# Patient Record
Sex: Female | Born: 1995 | Race: Black or African American | Hispanic: No | Marital: Single | Smoking: Never smoker
Health system: Southern US, Community
[De-identification: ages and names within clinical notes are randomized; demographics above are authoritative.]

## PROBLEM LIST (undated history)

## (undated) HISTORY — PX: TONSILLECTOMY: SUR1361

---
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
## 2016-02-16 IMAGING — CR DG CHEST 2V
2 series · 2 of 2 positions shown · non-contrast
Comparison: None.

CLINICAL DATA: Cough

EXAM:
CHEST  2 VIEW

[chest pa]
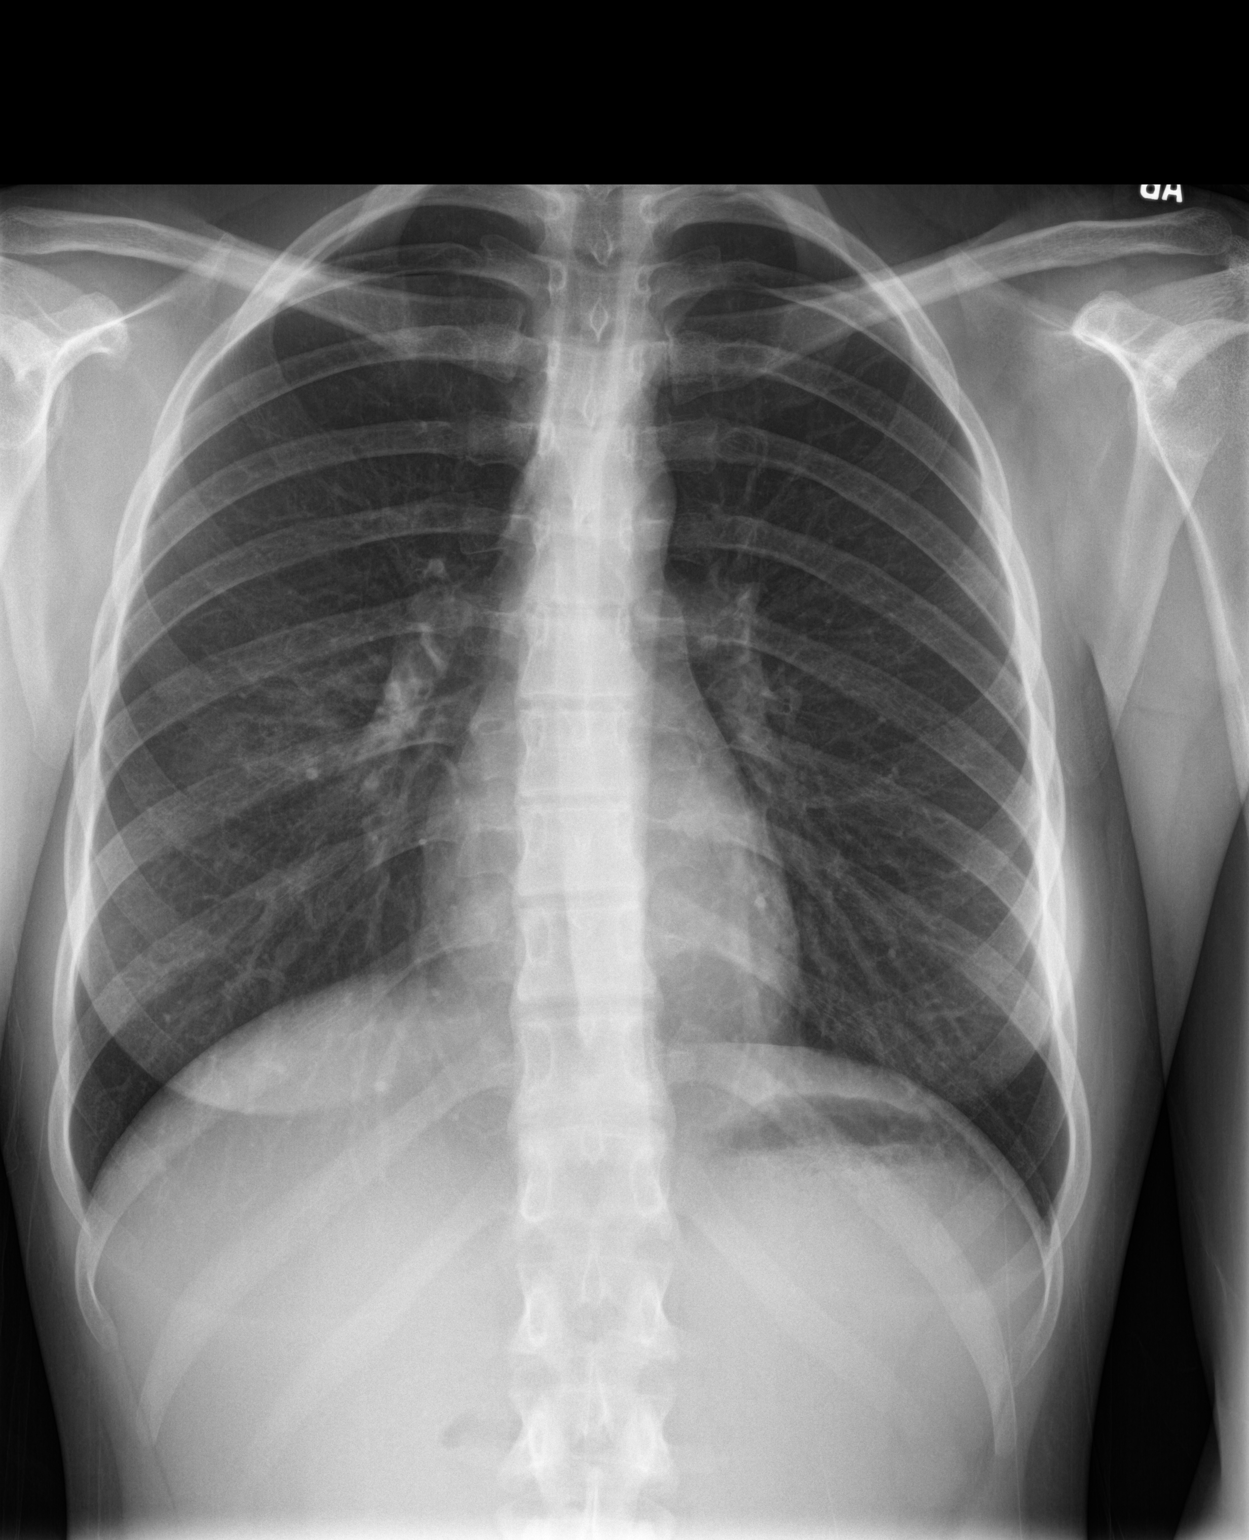

[chest lat]
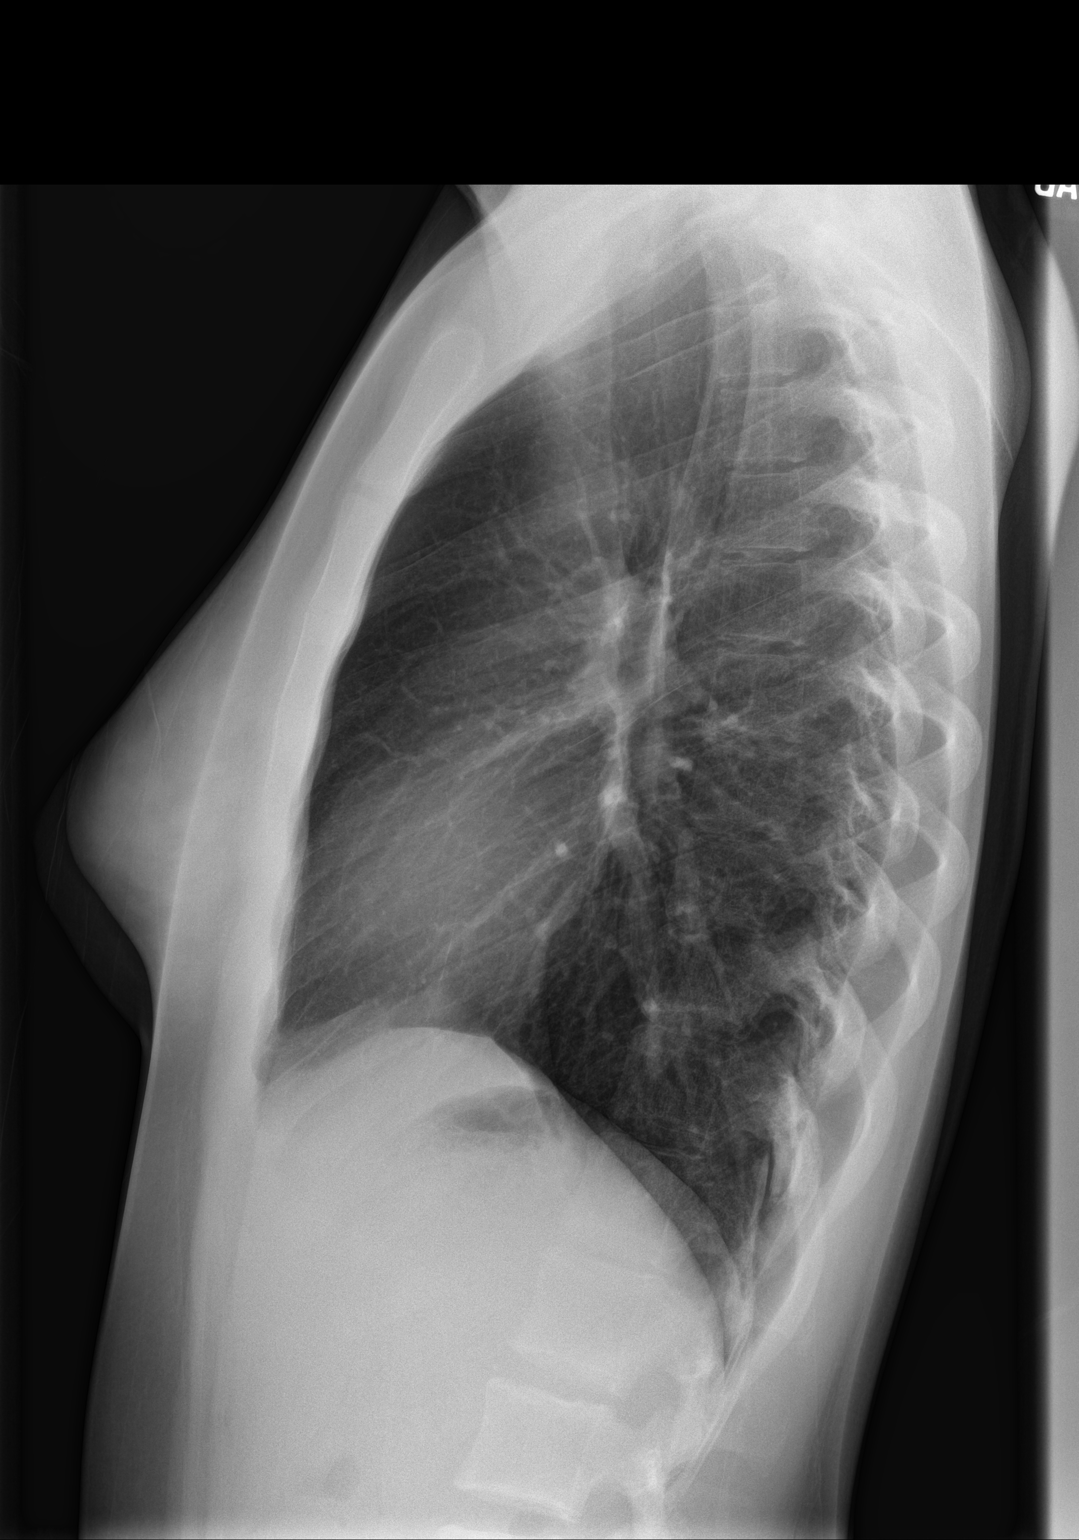

[2 of 2 positions shown; findings below may reference images not displayed]

FINDINGS: The heart size and mediastinal contours are within normal limits.
Both lungs are clear. The visualized skeletal structures are
unremarkable.
IMPRESSION: No active cardiopulmonary disease.
